# Patient Record
Sex: Male | Born: 1960 | Race: Black or African American | Hispanic: No | Marital: Married | Smoking: Former smoker
Health system: Southern US, Community
[De-identification: ages and names within clinical notes are randomized; demographics above are authoritative.]

## PROBLEM LIST (undated history)

## (undated) DIAGNOSIS — E7800 Pure hypercholesterolemia, unspecified: Secondary | ICD-10-CM

---

## 2003-01-10 ENCOUNTER — Emergency Department (HOSPITAL_COMMUNITY): Admission: EM | Admit: 2003-01-10 | Discharge: 2003-01-11 | Payer: Self-pay | Admitting: Emergency Medicine

## 2003-01-21 ENCOUNTER — Emergency Department (HOSPITAL_COMMUNITY): Admission: EM | Admit: 2003-01-21 | Discharge: 2003-01-21 | Payer: Self-pay | Admitting: Emergency Medicine

## 2003-12-12 ENCOUNTER — Emergency Department (HOSPITAL_COMMUNITY): Admission: EM | Admit: 2003-12-12 | Discharge: 2003-12-12 | Payer: Self-pay | Admitting: Emergency Medicine

## 2003-12-18 ENCOUNTER — Emergency Department (HOSPITAL_COMMUNITY): Admission: EM | Admit: 2003-12-18 | Discharge: 2003-12-18 | Payer: Self-pay | Admitting: Emergency Medicine

## 2006-02-01 ENCOUNTER — Emergency Department (HOSPITAL_COMMUNITY): Admission: EM | Admit: 2006-02-01 | Discharge: 2006-02-01 | Payer: Self-pay | Admitting: Emergency Medicine

## 2006-08-02 ENCOUNTER — Emergency Department (HOSPITAL_COMMUNITY): Admission: EM | Admit: 2006-08-02 | Discharge: 2006-08-02 | Payer: Self-pay | Admitting: Emergency Medicine

## 2006-08-09 ENCOUNTER — Ambulatory Visit (HOSPITAL_COMMUNITY): Admission: AD | Admit: 2006-08-09 | Discharge: 2006-08-09 | Payer: Self-pay | Admitting: Internal Medicine

## 2008-01-27 IMAGING — CT CT CHEST W/O CM
2 of 3 series · 15 of 36 positions shown, 18 images · IV contrast (agent unspecified)
Comparison: none

CLINICAL DATA: Abnormal CXR.  Question of 6 mm right upper lobe nodule on CXR.  History of recent cough and congestion. Night sweats.
CHEST CT WITHOUT CONTRAST:
TECHNIQUE: Multidetector CT imaging of the chest was performed following the standard protocol without IV contrast.

[Series 2: chestroutine 5.0 b40f · axial · 0.66mm/px · z∈[-346,-41]mm · 12 of 73 slices shown, 15 images]
[im 6/73  mediastinal]
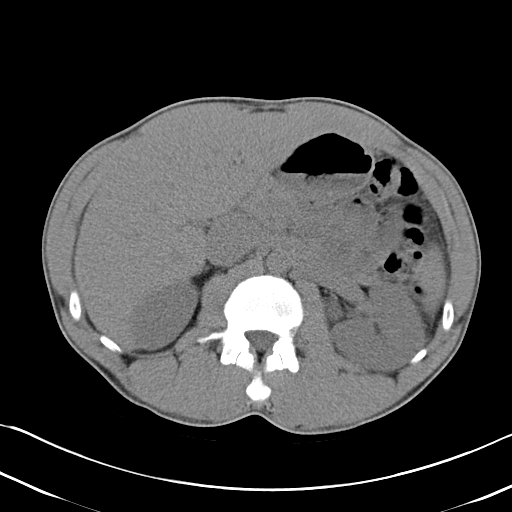
[im 6/73  lung]
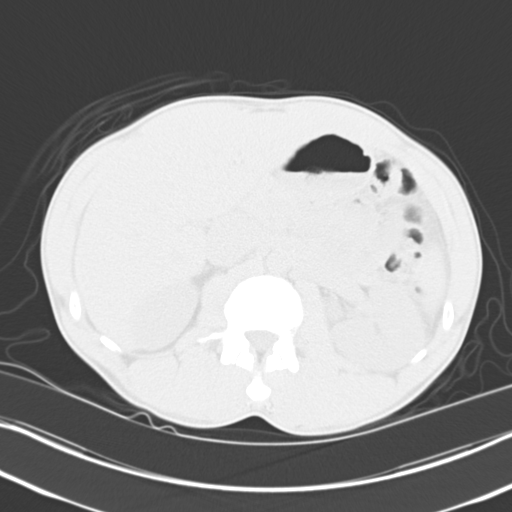
[im 11/73  lung]
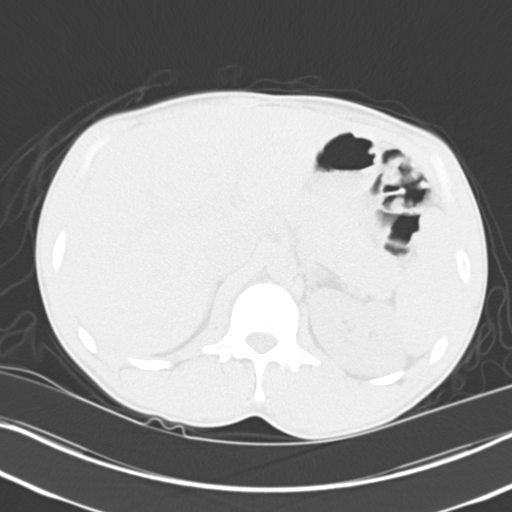
[im 17/73  lung]
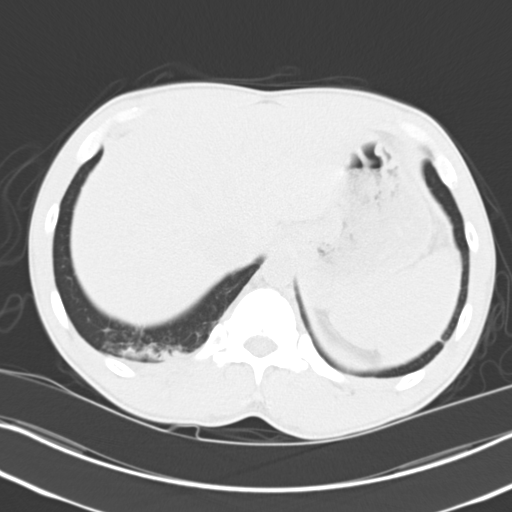
[im 22/73  lung]
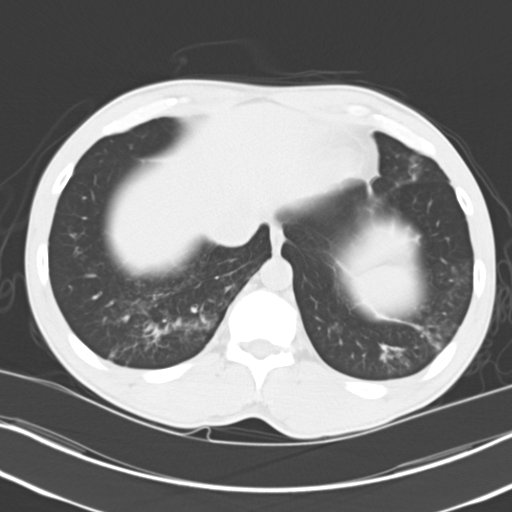
[im 27/73  mediastinal]
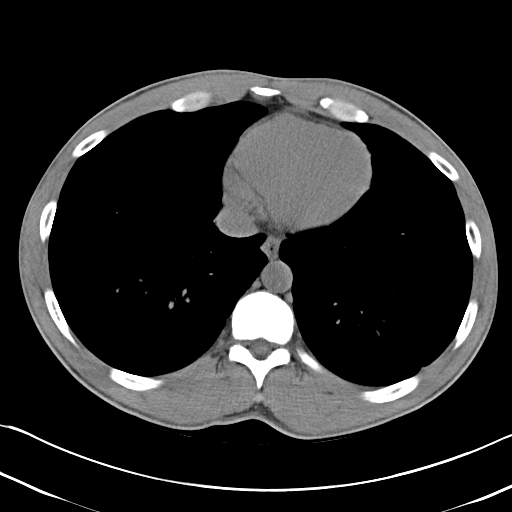
[im 27/73  lung]
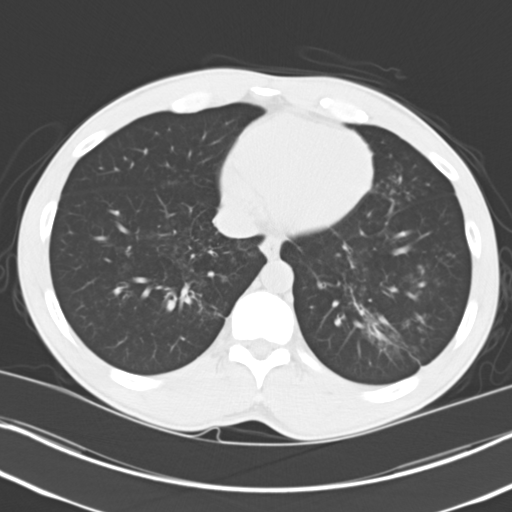
[im 33/73  lung]
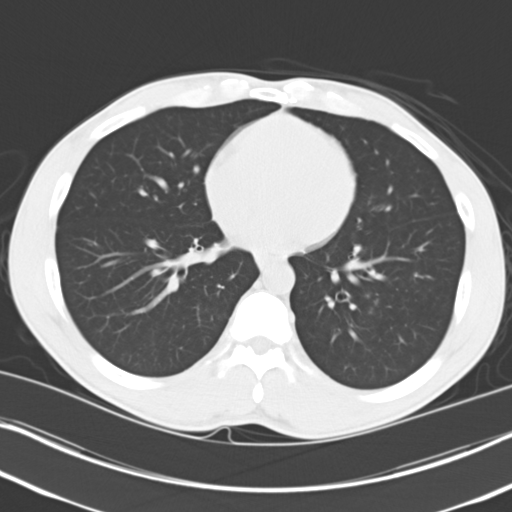
[im 41/73  lung]
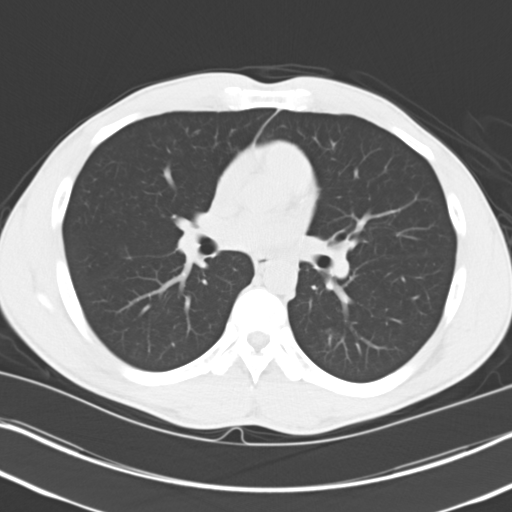
[im 46/73  lung]
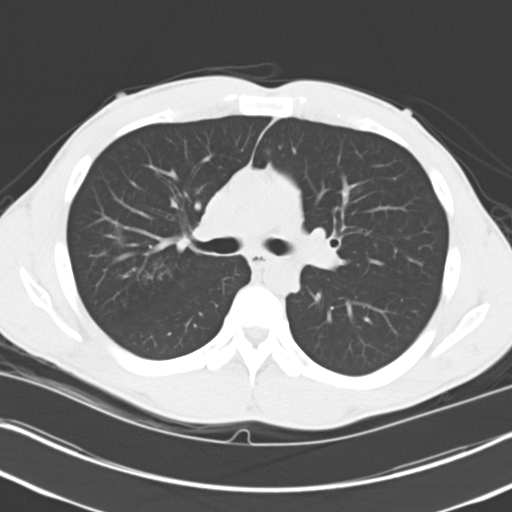
[im 51/73  mediastinal]
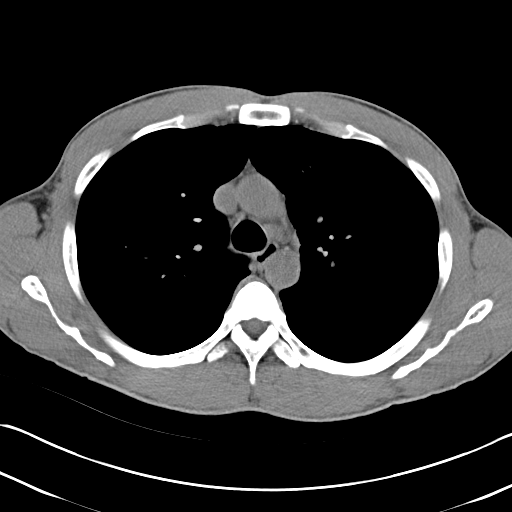
[im 51/73  lung]
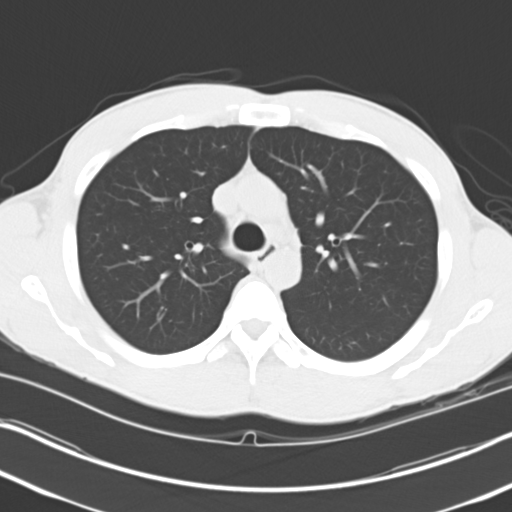
[im 57/73  lung]
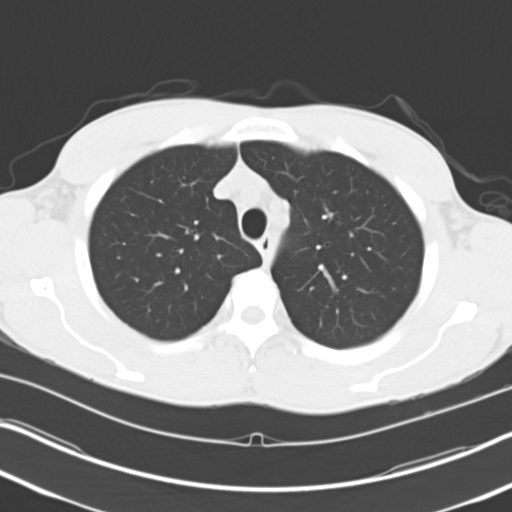
[im 62/73  lung]
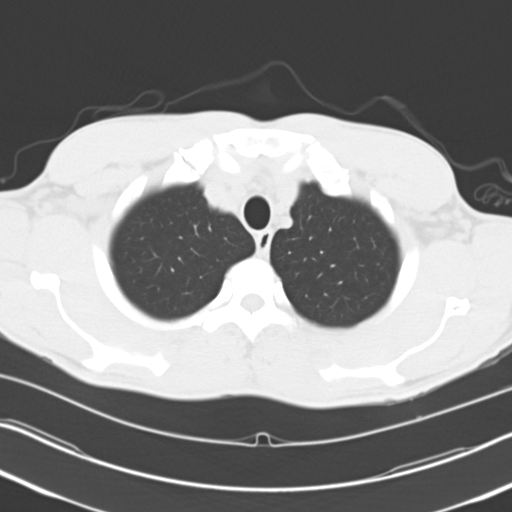
[im 67/73  lung]
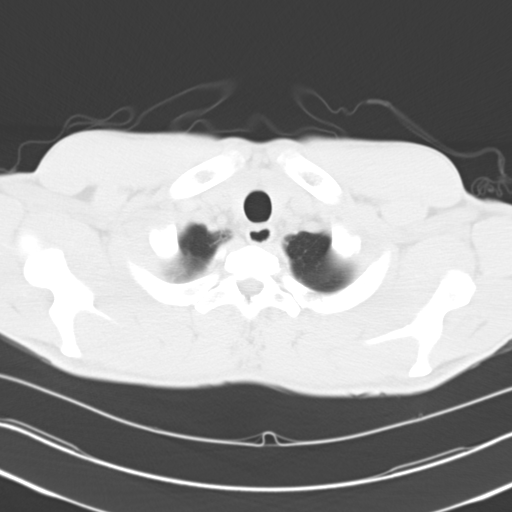

[Series 4: chestroutine 3.0 spo cor · coronal · 0.64mm/px · 3 of 67 slices shown]
[im 14/67  lung]
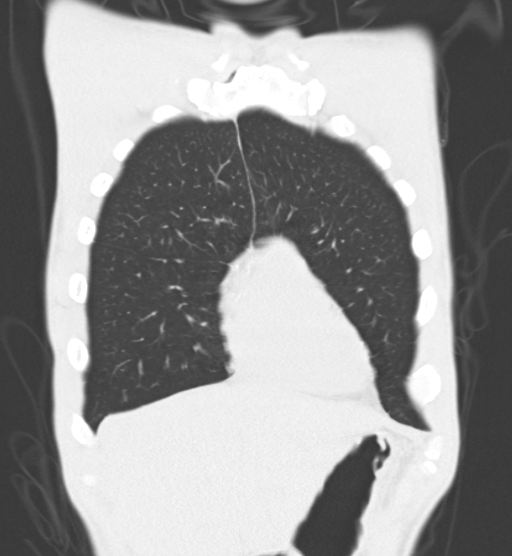
[im 27/67  lung]
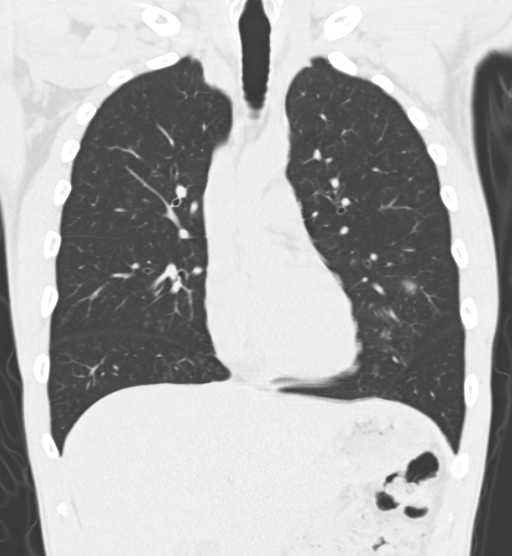
[im 40/67  lung]
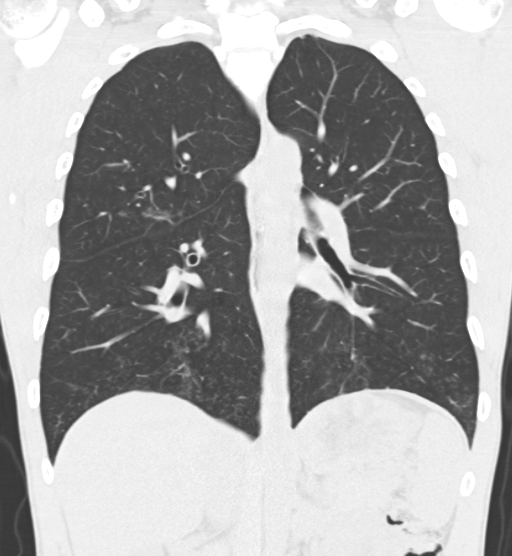

[15 of 36 positions shown; findings below may reference images not displayed]

FINDINGS: No pathologically enlarged mediastinal or hilar lymph nodes. Slightly prominent sized axillary lymph nodes bilaterally. Right upper lobe infiltrative like density having the appearance of bronchiolitis with secretions within small airways/bronchioles. There is a tendency towards the formation of so-called tree-in-bud pattern. Similar appearing densities in the superior segments of the lower lobes and also noted is a somewhat ill-defined patch of ground-glass density in the posterior and mid aspect of the left upper lobe and more inferiorly involving the lingula. A similar appearing infiltrative-like density in the posterior aspect of the right middle lobe and particularly involving the right and left lower lobes. Suggestion of minimal dilatation of small segmental and subsegmental bronchi (mild bronchiectasis in the lower lobes).  No evidence of a distinct nodular density to correspond with the density noted on CXR.  It may be part of the process noted. 
The lungs are hyperaerated.  This may be due to COPD.
IMPRESSION: Pulmonary findings described above are compatible with small airways disease including mild bronchiectasis and multifocal small airway infiltrates/secretions ? bronchiolitis. A couple of considerations are TB with endobronchial spread and also atypical mycobacterial infections (TAYA).

## 2018-05-23 ENCOUNTER — Encounter (HOSPITAL_COMMUNITY): Payer: Self-pay | Admitting: Emergency Medicine

## 2018-05-23 ENCOUNTER — Other Ambulatory Visit: Payer: Self-pay

## 2018-05-23 ENCOUNTER — Emergency Department (HOSPITAL_COMMUNITY)
Admission: EM | Admit: 2018-05-23 | Discharge: 2018-05-23 | Disposition: A | Discharge status: Home / Self Care | Payer: Self-pay | Attending: Emergency Medicine | Admitting: Emergency Medicine

## 2018-05-23 DIAGNOSIS — B029 Zoster without complications: Secondary | ICD-10-CM | POA: Insufficient documentation

## 2018-05-23 DIAGNOSIS — F17220 Nicotine dependence, chewing tobacco, uncomplicated: Secondary | ICD-10-CM | POA: Insufficient documentation

## 2018-05-23 HISTORY — DX: Pure hypercholesterolemia, unspecified: E78.00

## 2018-05-23 MED ORDER — DIPHENHYDRAMINE HCL 25 MG PO CAPS
25.0000 mg | ORAL_CAPSULE | Freq: Once | ORAL | Status: AC
  Administered 2018-05-23: 25 mg via ORAL
  Filled 2018-05-23: qty 1

## 2018-05-23 MED ORDER — ACYCLOVIR 400 MG PO TABS: 800.0000 mg | ORAL_TABLET | Freq: Every day | ORAL | 0 refills | Status: AC

## 2018-05-23 MED ORDER — ACYCLOVIR 800 MG PO TABS
800.0000 mg | ORAL_TABLET | Freq: Once | ORAL | Status: AC
  Administered 2018-05-23: 800 mg via ORAL
  Filled 2018-05-23: qty 1

## 2018-05-23 MED ORDER — PREDNISONE 20 MG PO TABS: ORAL_TABLET | ORAL | 0 refills | Status: AC

## 2018-05-23 MED ORDER — GABAPENTIN 100 MG PO CAPS: 100.0000 mg | ORAL_CAPSULE | Freq: Three times a day (TID) | ORAL | 0 refills | Status: AC

## 2018-05-23 MED ORDER — PREDNISONE 10 MG PO TABS
60.0000 mg | ORAL_TABLET | Freq: Once | ORAL | Status: AC
Start: 2018-05-23 — End: 2018-05-23
  Administered 2018-05-23: 60 mg via ORAL
  Filled 2018-05-23: qty 1

## 2018-05-23 NOTE — ED Provider Notes (Signed)
Emergency Department Provider Note   I have reviewed the triage vital signs and the nursing notes.   HISTORY  Chief Complaint Rash   HPI Duane Howard is a 57 y.o. male who presents the emergency department today secondary to 2 days of vesicular rash to his right lateral arm and in the right just before his thumb.  States is been progressively worsening.  Start with 1 little patch with burning and tingling and has progressed since then.  No fevers, nausea, vomiting or other symptoms.  States he has had contact with multiple poison ivy, poison sumac and other things the past without reaction.  He has no rash anywhere else.  No headache or change in mental status. No other associated or modifying symptoms.    Past Medical History:  Diagnosis Date  . Hypercholesteremia     There are no active problems to display for this patient.   History reviewed. No pertinent surgical history.  Current Outpatient Rx  . Order #: [id number] Class: Print  . Order #: [id number] Class: Print  . Order #: [id number] Class: Print    Allergies Patient has no known allergies.  Family History  Problem Relation Age of Onset  . Cancer Other     Social History Social History   Tobacco Use  . Smoking status: Former Games developermoker  . Smokeless tobacco: Current User    Types: Chew  Substance Use Topics  . Alcohol use: Yes  . Drug use: Never    Review of Systems  All other systems negative except as documented in the HPI. All pertinent positives and negatives as reviewed in the HPI. ____________________________________________   PHYSICAL EXAM:  VITAL SIGNS: ED Triage Vitals  Enc Vitals Group     BP 05/23/18 1524 (!) 169/93     Pulse Rate 05/23/18 1524 95     Resp 05/23/18 1524 18     Temp 05/23/18 1524 97.9 F (36.6 C)     Temp Source 05/23/18 1524 Oral     SpO2 05/23/18 1524 100 %     Weight 05/23/18 1525 184 lb 9.6 oz (83.7 kg)     Height 05/23/18 1525 5\' 6"  (1.676 m)     Head  Circumference --      Peak Flow --      Pain Score 05/23/18 1524 10     Pain Loc --      Pain Edu? --      Excl. in GC? --     Constitutional: Alert and oriented. Well appearing and in no acute distress. Eyes: Conjunctivae are normal. PERRL. EOMI. Head: Atraumatic. Nose: No congestion/rhinnorhea. Mouth/Throat: Mucous membranes are moist.  Oropharynx non-erythematous. Neck: No stridor.  No meningeal signs.   Cardiovascular: Normal rate, regular rhythm. Good peripheral circulation. Grossly normal heart sounds.   Respiratory: Normal respiratory effort.  No retractions. Lungs CTAB. Gastrointestinal: Soft and nontender. No distention.  Musculoskeletal: No lower extremity tenderness nor edema. No gross deformities of extremities. Neurologic:  Normal speech and language. No gross focal neurologic deficits are appreciated.  Skin:  Vesicular rash in right arm from shoulder to just proximal to thenar eminence. Mild erythema. Most vesicles are still intact, a couple have been deroofed. None on other side of arm. None on chest. None on other arm.   ____________________________________________   INITIAL IMPRESSION / ASSESSMENT AND PLAN / ED COURSE  Suspect likely shingles. Secondary to the obvious delineation on his arm I think it less likely to be contact dermatitis plus is not  anywhere else.  He has had chickenpox in the past.  Will treat with steroids (in the small chance that this is contact dermititis), antiviral, Neurontin and Benadryl as needed. Low suspicion for systemic involvement.   He does not have a PCP and it appears to Dr. Juanetta Gosling is on call for unassigned patients at this time so referred him to follow-up in a weeks.  Also need reevaluation of his blood pressure.  Pertinent labs & imaging results that were available during my care of the patient were reviewed by me and considered in my medical decision making (see chart for  details).  ____________________________________________  FINAL CLINICAL IMPRESSION(S) / ED DIAGNOSES  Final diagnoses:  Herpes zoster without complication     MEDICATIONS GIVEN DURING THIS VISIT:  Medications  predniSONE (DELTASONE) tablet 60 mg (60 mg Oral Given 05/23/18 1559)  acyclovir (ZOVIRAX) tablet 800 mg (800 mg Oral Given 05/23/18 1600)  diphenhydrAMINE (BENADRYL) capsule 25 mg (25 mg Oral Given 05/23/18 1559)     NEW OUTPATIENT MEDICATIONS STARTED DURING THIS VISIT:  New Prescriptions   ACYCLOVIR (ZOVIRAX) 400 MG TABLET    Take 2 tablets (800 mg total) by mouth 5 (five) times daily for 14 days.   GABAPENTIN (NEURONTIN) 100 MG CAPSULE    Take 1 capsule (100 mg total) by mouth 3 (three) times daily.   PREDNISONE (DELTASONE) 20 MG TABLET    3 tabs po daily x 3 days, then 2 tabs x 3 days, then 1.5 tabs x 3 days, then 1 tab x 3 days, then 0.5 tabs x 3 days    Note:  This note was prepared with assistance of Dragon voice recognition software. Occasional wrong-word or sound-a-like substitutions may have occurred due to the inherent limitations of voice recognition software.   Marily Memos, MD 05/23/18 [id number]

## 2018-05-23 NOTE — ED Triage Notes (Signed)
Patient with a rash to his Right arm. Blistering to his arm since Sunday. Patient states the rash itches and burns.
